# Patient Record
Sex: Male | Born: 1995 | Race: White | Hispanic: No | Marital: Single | Smoking: Never smoker
Health system: Southern US, Community
[De-identification: ages and names within clinical notes are randomized; demographics above are authoritative.]

## PROBLEM LIST (undated history)

## (undated) HISTORY — PX: OTHER SURGICAL HISTORY: SHX169

---

## 2015-05-09 ENCOUNTER — Encounter: Payer: Self-pay | Admitting: Urology

## 2015-05-09 ENCOUNTER — Ambulatory Visit (INDEPENDENT_AMBULATORY_CARE_PROVIDER_SITE_OTHER): Payer: Commercial Indemnity | Admitting: Urology

## 2015-05-09 VITALS — BP 138/81 | HR 80 | Ht 68.0 in | Wt 197.7 lb

## 2015-05-09 DIAGNOSIS — I861 Scrotal varices: Secondary | ICD-10-CM | POA: Diagnosis not present

## 2015-05-09 NOTE — Progress Notes (Signed)
05/09/2015 10:34 AM   Cody Rose [birth year] [id number]  Referring provider: No referring provider defined for this encounter.  Chief Complaint  Patient presents with  . Testicle Pain    HPI: Patient is a 19 year old white male who found left testicular swelling incidentally found during self exam who presents today for further evaluation and management. He states he first noticed it a 1 month ago.  He states it comes and goes. It is worse when he stands on his feet for long periods of time.    He does not have a prior history of scrotal infection or trauma to the scrotal area.  He has no personal history of testicular cancer. There is no family history of testicular cancer.  He has not had any erythema or tenderness associated with the swelling.  He is not experiencing difficulty with urination. He denies fevers, chills, nausea and vomiting.  He has been seen at an urgent care center and was given 2 rounds of antibiotics. This did not improve the swelling or discomfort.  I do not have those records available for me at this visit. He has not had any imaging of the scrotal area.   PMH: History reviewed. No pertinent past medical history.  Surgical History: History reviewed. No pertinent past surgical history.  Home Medications:    Medication List    Notice  As of 05/09/2015 11:59 PM   You have not been prescribed any medications.      Allergies: No Known Allergies  Family History: Family History  Problem Relation Age of Onset  . Kidney cancer Neg Hx   . Kidney disease Neg Hx   . Prostate cancer Neg Hx     Social History:  reports that he has never smoked. He does not have any smokeless tobacco history on file. He reports that he drinks alcohol. He reports that he does not use illicit drugs.  ROS: UROLOGY Frequent Urination?: No Hard to postpone urination?: No Burning/pain with urination?: No Get up at night to urinate?: No Leakage of urine?: No Urine stream  starts and stops?: No Trouble starting stream?: No Do you have to strain to urinate?: No Blood in urine?: No Urinary tract infection?: No Sexually transmitted disease?: No Injury to kidneys or bladder?: No Painful intercourse?: No Weak stream?: No Erection problems?: No Penile pain?: Yes  Gastrointestinal Nausea?: No Vomiting?: No Indigestion/heartburn?: No Diarrhea?: No Constipation?: No  Constitutional Fever: No Night sweats?: No Weight loss?: No Fatigue?: No  Skin Skin rash/lesions?: No Itching?: No  Eyes Blurred vision?: No Double vision?: No  Ears/Nose/Throat Sore throat?: No Sinus problems?: No  Hematologic/Lymphatic Swollen glands?: No Easy bruising?: No  Cardiovascular Leg swelling?: No Chest pain?: No  Respiratory Cough?: No Shortness of breath?: No  Endocrine Excessive thirst?: No  Musculoskeletal Back pain?: No Joint pain?: No  Neurological Headaches?: No Dizziness?: No  Psychologic Depression?: No Anxiety?: No  Physical Exam: BP 138/81 mmHg  Pulse 80  Ht  (1.727 m)  Wt 197 lb 11.2 oz (89.676 kg)  BMI 30.07 kg/m2  Constitutional: Well nourished. Alert and oriented, No acute distress. HEENT: [place] AT, moist mucus membranes. Trachea midline, no masses. Cardiovascular: No clubbing, cyanosis, or edema. Respiratory: Normal respiratory effort, no increased work of breathing. GI: Abdomen is soft, non tender, non distended, no abdominal masses. Liver and spleen not palpable.  No hernias appreciated.  Stool sample for occult testing is not indicated.   GU: No CVA tenderness.  No bladder fullness or  masses.  Patient with circumcised phallus.  Urethral meatus is patent.  No penile discharge. No penile lesions or rashes. Scrotum without lesions, cysts, rashes and/or edema.  Testicles are located scrotally bilaterally. No masses are appreciated in the testicles. Left and right epididymis are normal.  Left varicocele is noted.   Skin: No  rashes, bruises or suspicious lesions. Lymph: No cervical or inguinal adenopathy. Neurologic: Grossly intact, no focal deficits, moving all 4 extremities. Psychiatric: Normal mood and affect.  Laboratory Data:   Assessment & Plan:    1. Left varicocele:    Patient was found to have a left varicocele.  He will undergo scrotal ultrasound for further evaluation of the testicles.  I reassured the patient about the findings.   I did discuss the possibility of affecting his ability to father children in the future.  He states he and his girlfriend had just started to try to have a child.  They have been unsuccessful so far.  I discussed conservative management of the varicocele for the time being.  He will return to the office for scrotal ultrasound report.    We will discuss fertility issues at greater length when he returns for his report.    Return for scrotal ultrasound report.  [name], PA-C  [facility] [address], [address] [place], [place] [address] [phone]

## 2015-05-11 ENCOUNTER — Encounter: Payer: Self-pay | Admitting: Urology

## 2015-05-20 ENCOUNTER — Ambulatory Visit
Admission: RE | Admit: 2015-05-20 | Discharge: 2015-05-20 | Disposition: A | Discharge status: Home / Self Care | Payer: Managed Care, Other (non HMO) | Source: Ambulatory Visit | Attending: Urology | Admitting: Urology

## 2015-05-20 DIAGNOSIS — I861 Scrotal varices: Secondary | ICD-10-CM

## 2015-05-20 DIAGNOSIS — N433 Hydrocele, unspecified: Secondary | ICD-10-CM | POA: Diagnosis not present

## 2015-05-27 ENCOUNTER — Telehealth: Payer: Self-pay | Admitting: Urology

## 2015-05-27 ENCOUNTER — Encounter: Payer: Self-pay | Admitting: Urology

## 2015-05-27 ENCOUNTER — Ambulatory Visit: Payer: Commercial Indemnity | Admitting: Urology

## 2015-05-27 NOTE — Telephone Encounter (Signed)
Results printed and mailed.   

## 2015-05-27 NOTE — Telephone Encounter (Signed)
Please mail the patient's scrotal ultrasound report, so he can have the results.

## 2015-05-27 NOTE — Telephone Encounter (Signed)
Patient was called into work this morning and could not make his 830 appointment for US results.  He is inquiring if someone could please call him with results.  If you would prefer, I can reschedule his appointment.  Please advise.

## 2016-02-14 ENCOUNTER — Encounter: Payer: Self-pay | Admitting: Urology

## 2016-02-14 ENCOUNTER — Ambulatory Visit (INDEPENDENT_AMBULATORY_CARE_PROVIDER_SITE_OTHER): Payer: Managed Care, Other (non HMO) | Admitting: Urology

## 2016-02-14 VITALS — BP 138/78 | HR 98 | Ht 70.0 in | Wt 211.9 lb

## 2016-02-14 DIAGNOSIS — N489 Disorder of penis, unspecified: Secondary | ICD-10-CM | POA: Diagnosis not present

## 2016-02-14 DIAGNOSIS — R300 Dysuria: Secondary | ICD-10-CM | POA: Diagnosis not present

## 2016-02-14 NOTE — Progress Notes (Signed)
02/14/2016 8:58 PM   Cody Rose Aug 09, 1995 [id number]  Referring provider: No referring provider defined for this encounter.  Chief Complaint  Patient presents with  . Penis Pain    patient states tip of penis tingling and blue    HPI: Patient is a 20 year old Caucasian male who presents today stating that his head of his penis is blue and the tip of the penis is tingling.  Patient states that 3 weeks ago he noted a tingling sensation in the tip of his penis during urination. The sensation occurred with several urination over the next few days. It abated suddenly a few days ago. He currently does not have dysuria, gross hematuria or suprapubic pain. He has not had fevers, chills, nausea or vomiting. He did not have any flank pain. He did not pass a stone fragment.  He then noted that the underside of the tip of his penis had a bluish hue.  He denied any trauma to his penis. He denied applying any new creams or herbal supplements to the penis. He is not having pain with sex.  He has been masturbating.  There is been no curvature to his penis upon erection.   PMH: No past medical history on file.  Surgical History: Past Surgical History:  Procedure Laterality Date  . none      Home Medications:    Medication List       Accurate as of 02/14/16 11:59 PM. Always use your most recent med list.          omeprazole 20 MG capsule Commonly known as:  PRILOSEC Take by mouth.       Allergies: No Known Allergies  Family History: Family History  Problem Relation Age of Onset  . Kidney cancer Neg Hx   . Kidney disease Neg Hx   . Prostate cancer Neg Hx     Social History:  reports that he has never smoked. He has never used smokeless tobacco. He reports that he drinks alcohol. He reports that he does not use drugs.  ROS: UROLOGY Frequent Urination?: No Hard to postpone urination?: No Burning/pain with urination?: No Get up at night to urinate?: Yes Leakage of  urine?: No Urine stream starts and stops?: No Trouble starting stream?: No Do you have to strain to urinate?: No Blood in urine?: No Urinary tract infection?: No Sexually transmitted disease?: No Injury to kidneys or bladder?: No Painful intercourse?: No Weak stream?: No Erection problems?: No Penile pain?: No  Gastrointestinal Nausea?: No Vomiting?: No Indigestion/heartburn?: No Diarrhea?: No Constipation?: No  Constitutional Fever: No Night sweats?: No Weight loss?: No Fatigue?: No  Skin Skin rash/lesions?: No Itching?: No  Eyes Blurred vision?: No Double vision?: No  Ears/Nose/Throat Sore throat?: No Sinus problems?: No  Hematologic/Lymphatic Swollen glands?: No Easy bruising?: No  Cardiovascular Leg swelling?: No Chest pain?: No  Respiratory Cough?: No Shortness of breath?: No  Endocrine Excessive thirst?: No  Musculoskeletal Back pain?: No Joint pain?: No  Neurological Headaches?: No Dizziness?: No  Psychologic Depression?: No Anxiety?: No  Physical Exam: BP 138/78   Pulse 98   Ht 5\' 10"  (1.778 m)   Wt 211 lb 14.4 oz (96.1 kg)   BMI 30.40 kg/m   Constitutional: Well nourished. Alert and oriented, No acute distress. HEENT: [place] AT, moist mucus membranes. Trachea midline, no masses. Cardiovascular: No clubbing, cyanosis, or edema. Respiratory: Normal respiratory effort, no increased work of breathing. GI: Abdomen is soft, non tender, non distended, no abdominal  masses. Liver and spleen not palpable.  No hernias appreciated.  Stool sample for occult testing is not indicated.   GU: No CVA tenderness.  No bladder fullness or masses.  Patient with circumcised phallus.  Urethral meatus is patent.  No penile discharge. No penile lesions or rashes. Scrotum without lesions, cysts, rashes and/or edema.  Testicles are located scrotally bilaterally. No masses are appreciated in the testicles. Left and right epididymis are normal. Rectal:  Deferred. Skin: No rashes, bruises or suspicious lesions. Lymph: No cervical or inguinal adenopathy. Neurologic: Grossly intact, no focal deficits, moving all 4 extremities. Psychiatric: Normal mood and affect.   Assessment & Plan:    1. Dysuria  - resolved  - may have been a passage of a small stone  - instructed to contact the office if it occurs again  2. Abnormality of the penis  - reassured the patient that the exam was normal  - instructed to contact the office if experience pain with erections or curvature with erections   Return if symptoms worsen or fail to improve.  These notes generated with voice recognition software. I apologize for typographical errors.  Michiel Cowboy, PA-C  [facility] [address] [place], [place] [address] [phone]

## 2017-02-09 IMAGING — US US ART/VEN ABD/PELV/SCROTUM DOPPLER LTD
1 series · 14 of 25 positions shown · non-contrast
Comparison: None.

CLINICAL DATA: Left scrotal pain beginning 1 month ago
intermittently since then. Evaluate for varicocele.

EXAM:
SCROTAL ULTRASOUND
DOPPLER ULTRASOUND OF THE TESTICLES
TECHNIQUE: Complete ultrasound examination of the testicles, epididymis, and
other scrotal structures was performed. Color and spectral Doppler
ultrasound were also utilized to evaluate blood flow to the
testicles.

[Series 1: us art/ven abd/pelv/scrotum doppler ltd · 0.07mm/px · 14 of 60 slices shown]
[im 1/60]
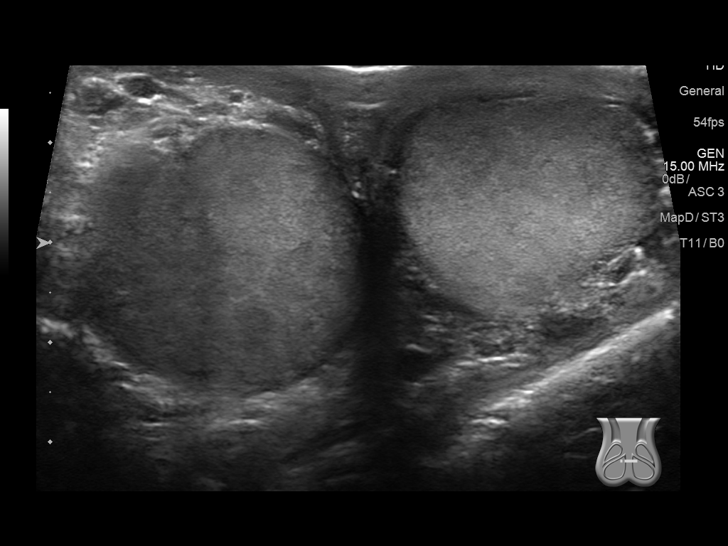
[im 5/60]
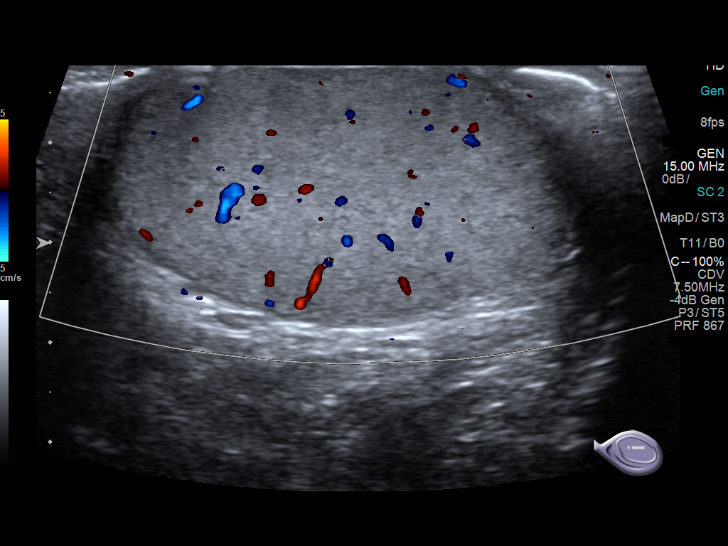
[im 10/60]
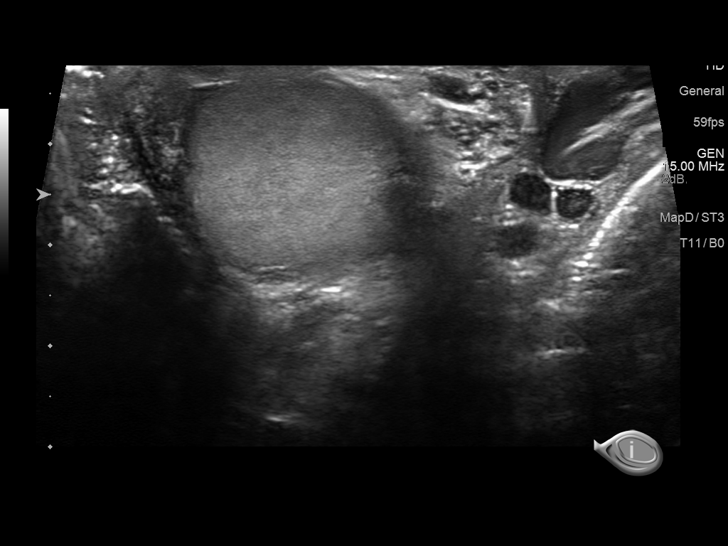
[im 15/60]
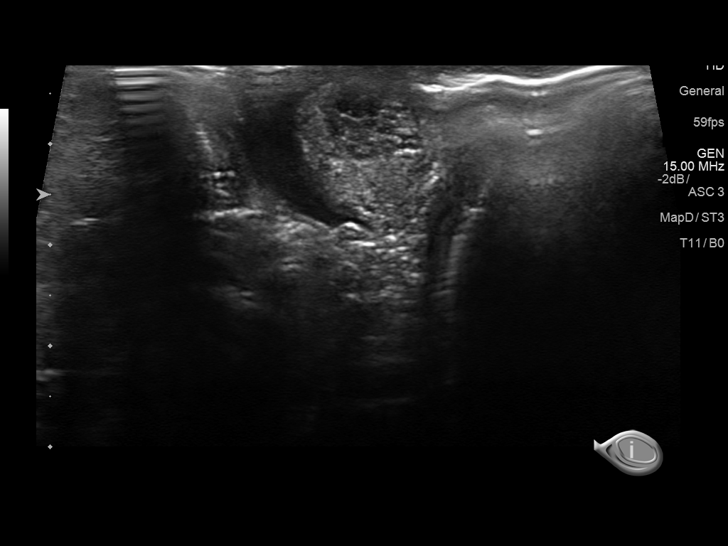
[im 20/60]
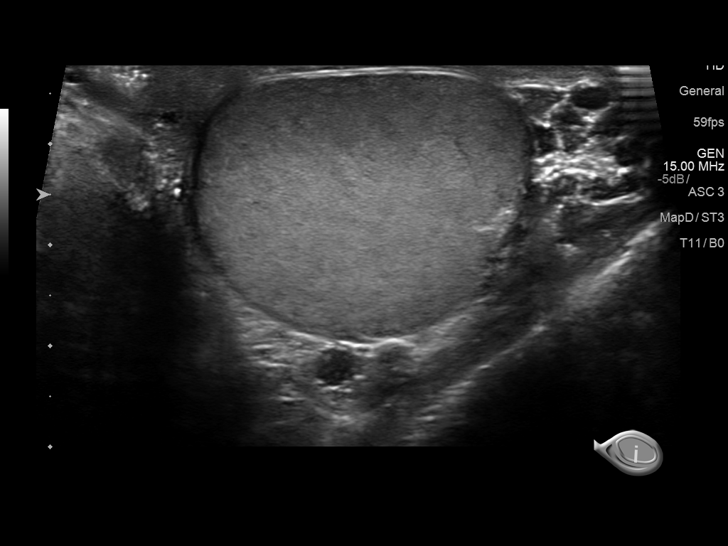
[im 23/60]
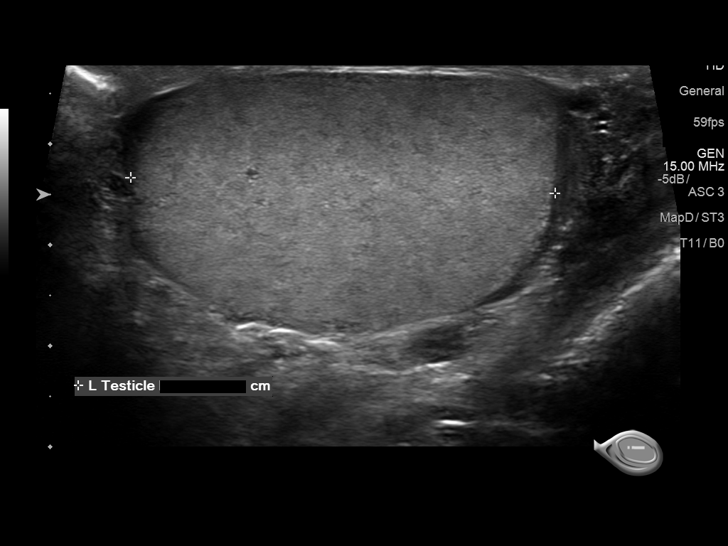
[im 28/60]
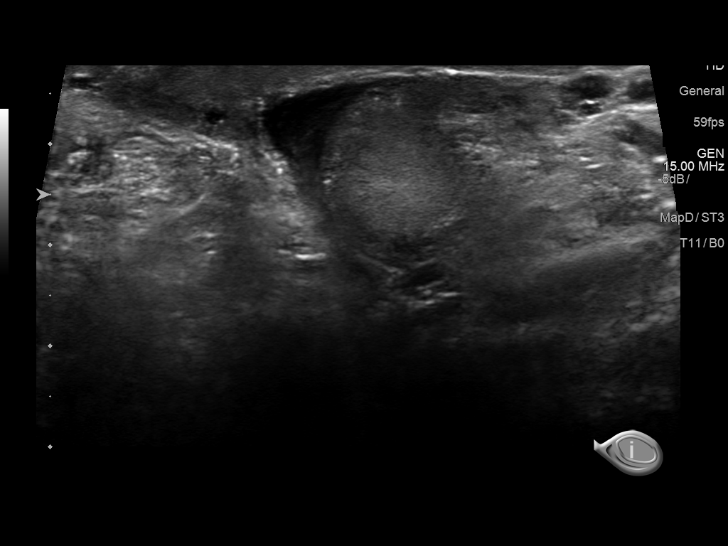
[im 32/60]
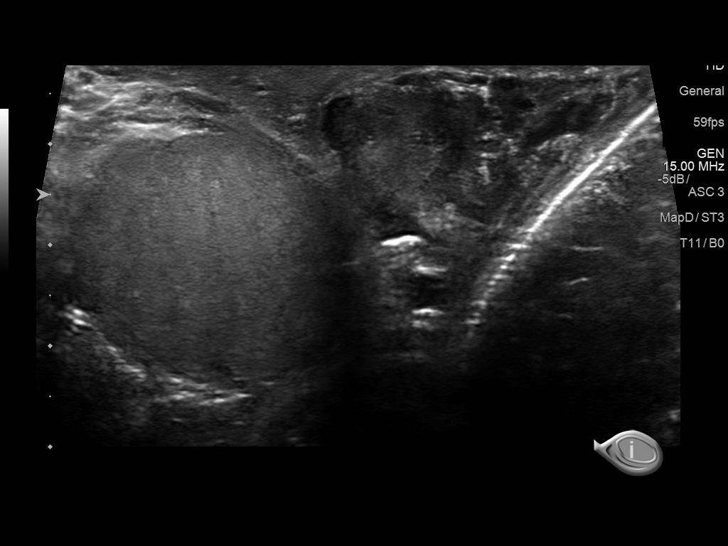
[im 37/60]
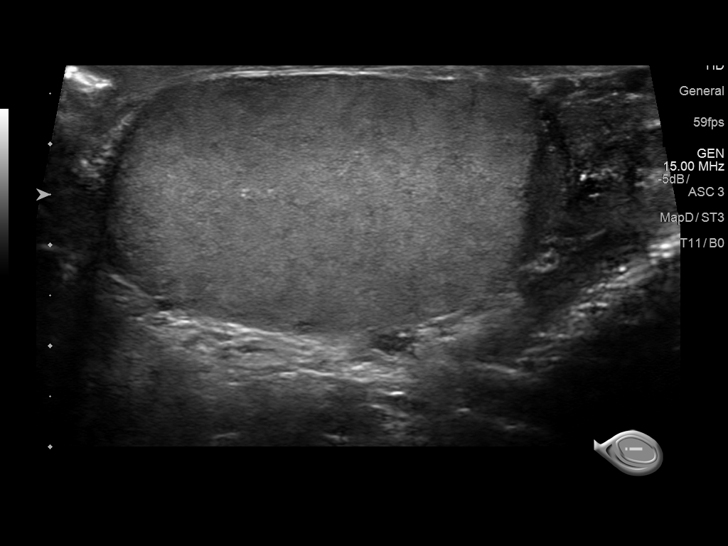
[im 40/60]
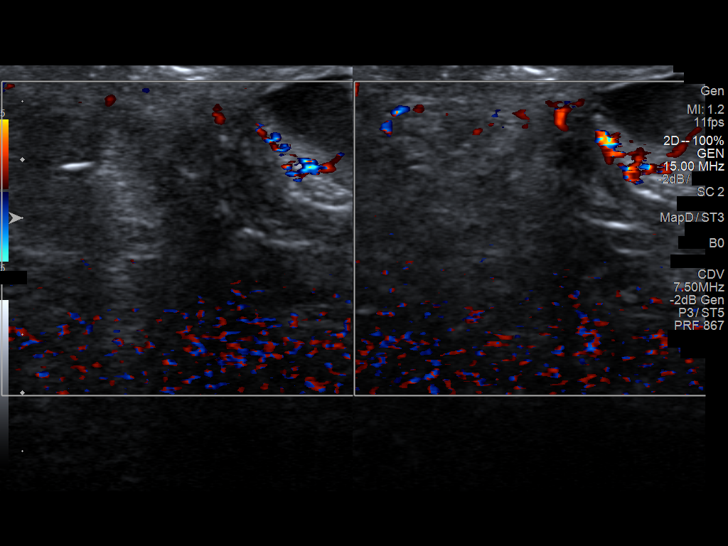
[im 45/60]
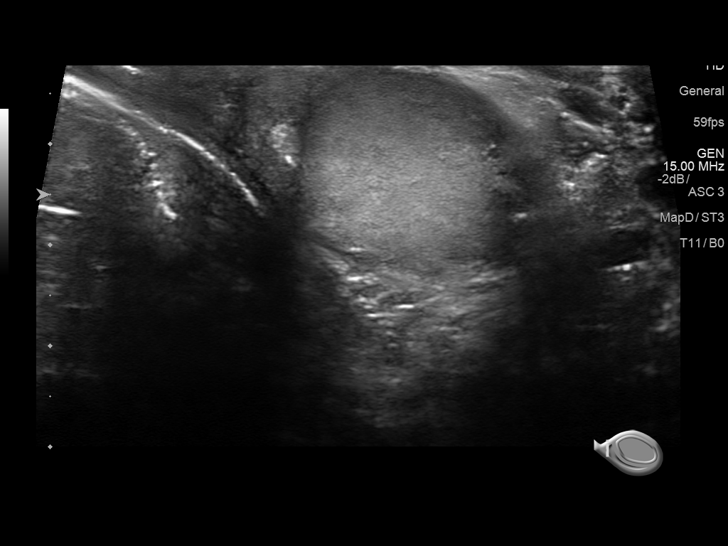
[im 50/60]
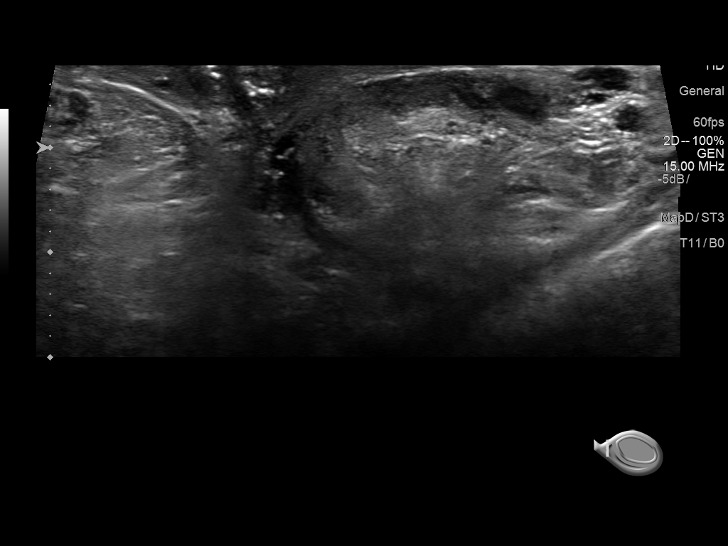
[im 55/60]
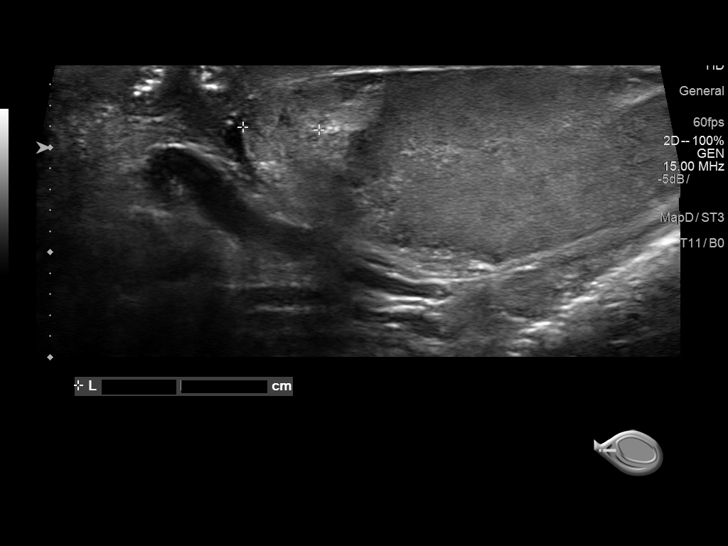
[im 60/60]
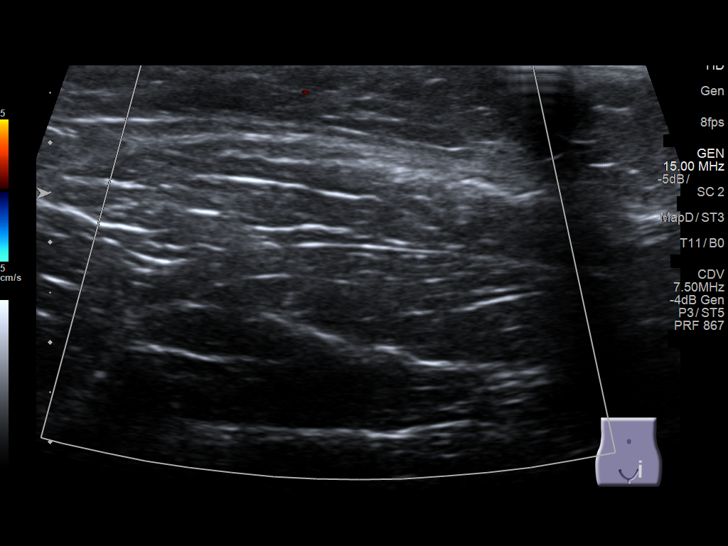

[14 of 25 positions shown; findings below may reference images not displayed]

FINDINGS: Right testicle

Measurements: 2.5 x 2.9 x 4.6 cm. No mass or microlithiasis
visualized.

Left testicle

Measurements: 2.6 x 3.3 x 4.2 cm. No mass or microlithiasis
visualized.

Right epididymis:  Normal in size and appearance.

Left epididymis:  Normal in size and appearance.

Hydrocele:  Very small bilateral hydroceles.

Varicocele:  Small to moderate left varicocele.

Pulsed Doppler interrogation of both testes demonstrates normal low
resistance arterial and venous waveforms bilaterally.
IMPRESSION: No acute findings.  Normal testicles.

Small to moderate left varicocele.

Very small bilateral hydroceles.
